# Patient Record
Sex: Female | Born: 1990 | Race: White | Hispanic: No | Marital: Single | Smoking: Never smoker
Health system: Southern US, Community
[De-identification: ages and names within clinical notes are randomized; demographics above are authoritative.]

## PROBLEM LIST (undated history)

## (undated) DIAGNOSIS — F32A Depression, unspecified: Secondary | ICD-10-CM

## (undated) HISTORY — DX: Depression, unspecified: F32.A

---

## 2015-08-29 ENCOUNTER — Ambulatory Visit (HOSPITAL_COMMUNITY)
Admission: EM | Admit: 2015-08-29 | Discharge: 2015-08-29 | Disposition: A | Discharge status: Home / Self Care | Payer: Self-pay | Attending: Family Medicine | Admitting: Family Medicine

## 2015-08-29 ENCOUNTER — Encounter (HOSPITAL_COMMUNITY): Payer: Self-pay | Admitting: *Deleted

## 2015-08-29 DIAGNOSIS — S61209A Unspecified open wound of unspecified finger without damage to nail, initial encounter: Secondary | ICD-10-CM

## 2015-08-29 MED ORDER — TETANUS-DIPHTH-ACELL PERTUSSIS 5-2.5-18.5 LF-MCG/0.5 IM SUSP
INTRAMUSCULAR | Status: AC
  Filled 2015-08-29: qty 0.5

## 2015-08-29 MED ORDER — TETANUS-DIPHTH-ACELL PERTUSSIS 5-2.5-18.5 LF-MCG/0.5 IM SUSP
0.5000 mL | Freq: Once | INTRAMUSCULAR | Status: AC
  Administered 2015-08-29: 0.5 mL via INTRAMUSCULAR

## 2015-08-29 MED ORDER — BUPIVACAINE HCL (PF) 0.5 % IJ SOLN
INTRAMUSCULAR | Status: AC
  Filled 2015-08-29: qty 10

## 2015-08-29 NOTE — ED Provider Notes (Signed)
CSN: [id number]     Arrival date & time 08/29/15  1823 History   First MD Initiated Contact with Patient 08/29/15 1932     Chief Complaint  Patient presents with  . Finger Injury   (Consider location/radiation/quality/duration/timing/severity/associated sxs/prior Treatment) Patient is a 25 y.o. female presenting with skin laceration. The history is provided by the patient and the spouse.  Laceration Location:  Finger Finger laceration location:  R ring finger Depth:  Through dermis Quality: avulsion   Bleeding: uncontrolled   Time since incident:  1 hour Laceration mechanism:  Metal edge Pain details:    Progression:  Unchanged Foreign body present:  No foreign bodies Ineffective treatments:  Pressure Tetanus status:  Out of date   History reviewed. No pertinent past medical history. History reviewed. No pertinent past surgical history. History reviewed. No pertinent family history. Social History  Substance Use Topics  . Smoking status: Never Smoker   . Smokeless tobacco: None  . Alcohol Use: Yes   OB History    No data available     Review of Systems  Constitutional: Negative.   Skin: Positive for wound.  All other systems reviewed and are negative.   Allergies  Review of patient's allergies indicates no known allergies.  Home Medications   Prior to Admission medications   Not on File   Meds Ordered and Administered this Visit   Medications  Tdap (BOOSTRIX) injection 0.5 mL (0.5 mLs Intramuscular Given 08/29/15 2032)    BP 124/80 mmHg  Pulse 51  Temp(Src) 98.5 F (36.9 C) (Oral)  Resp 12  SpO2 100%  LMP 08/14/2015 No data found.   Physical Exam  Constitutional: She is oriented to person, place, and time.  Musculoskeletal: She exhibits tenderness.  Oval 0.8 x 0.4 cm superficial skin avulsion of tip of rrf, nail intact., persistent bleeding.  Neurological: She is alert and oriented to person, place, and time.  Skin: Skin is warm and dry.   Nursing note and vitals reviewed.   ED Course  .Marland Kitchen.Laceration Repair Date/Time: 08/29/2015 8:22 PM Performed by: Linna HoffKINDL, JAMES D Authorized by: Bradd CanaryKINDL, JAMES D Consent: Verbal consent obtained. Risks and benefits: risks, benefits and alternatives were discussed Consent given by: patient Body area: upper extremity Location details: right ring finger Laceration length: 1 cm Foreign bodies: no foreign bodies Tendon involvement: none Nerve involvement: none Vascular damage: no Anesthesia: local infiltration Local anesthetic: bupivacaine 0.5% without epinephrine Anesthetic total: 2 ml Patient sedated: no Amount of cleaning: standard Debridement: none Degree of undermining: none Approximation difficulty: simple Dressing: gauze roll (silver nitrate hemostasis cautery and xeroform dsg and splint applied.)   (including critical care time)  Labs Review Labs Reviewed - No data to display  Imaging Review No results found.   Visual Acuity Review  Right Eye Distance:   Left Eye Distance:   Bilateral Distance:    Right Eye Near:   Left Eye Near:    Bilateral Near:         MDM   1. Avulsion of skin of finger, initial encounter        Linna HoffJames D Kindl, MD 08/29/15 2102

## 2015-08-29 NOTE — Discharge Instructions (Signed)
You had a tdap booster, leave bandaged for 1-2 days then bacitracin and bandaid until healed, expect 2-3 week healing time overall. Return if further problems

## 2015-08-29 NOTE — ED Notes (Signed)
Pt  Sustained  A  Finger  Laceration  To    r  Ring  Finger   Sustained    When  She  Was using  A  Cutting  Board        wound    Appears  To  Be  An avulsion

## 2017-10-16 LAB — HM PAP SMEAR

## 2020-03-02 ENCOUNTER — Other Ambulatory Visit: Payer: Self-pay

## 2020-03-02 ENCOUNTER — Encounter (HOSPITAL_COMMUNITY): Payer: Self-pay | Admitting: Emergency Medicine

## 2020-03-02 ENCOUNTER — Inpatient Hospital Stay (HOSPITAL_COMMUNITY): Payer: BC Managed Care – PPO

## 2020-03-02 ENCOUNTER — Ambulatory Visit (HOSPITAL_COMMUNITY)
Admission: EM | Admit: 2020-03-02 | Discharge: 2020-03-03 | Disposition: A | Discharge status: Home / Self Care | Payer: BC Managed Care – PPO | Attending: Obstetrics and Gynecology | Admitting: Obstetrics and Gynecology

## 2020-03-02 DIAGNOSIS — R1110 Vomiting, unspecified: Secondary | ICD-10-CM | POA: Insufficient documentation

## 2020-03-02 DIAGNOSIS — O3680X0 Pregnancy with inconclusive fetal viability, not applicable or unspecified: Secondary | ICD-10-CM

## 2020-03-02 DIAGNOSIS — O209 Hemorrhage in early pregnancy, unspecified: Secondary | ICD-10-CM

## 2020-03-02 DIAGNOSIS — O0000 Abdominal pregnancy without intrauterine pregnancy: Secondary | ICD-10-CM | POA: Diagnosis present

## 2020-03-02 DIAGNOSIS — Z20822 Contact with and (suspected) exposure to covid-19: Secondary | ICD-10-CM | POA: Insufficient documentation

## 2020-03-02 DIAGNOSIS — K661 Hemoperitoneum: Secondary | ICD-10-CM

## 2020-03-02 HISTORY — DX: Depression, unspecified: F32.A

## 2020-03-02 LAB — WET PREP, GENITAL
Clue Cells Wet Prep HPF POC: NONE SEEN
Sperm: NONE SEEN
Trich, Wet Prep: NONE SEEN
Yeast Wet Prep HPF POC: NONE SEEN

## 2020-03-02 LAB — CBC
HCT: 40.4 % (ref 36.0–46.0)
Hemoglobin: 13.5 g/dL (ref 12.0–15.0)
MCH: 30.3 pg (ref 26.0–34.0)
MCHC: 33.4 g/dL (ref 30.0–36.0)
MCV: 90.6 fL (ref 80.0–100.0)
Platelets: 209 10*3/uL (ref 150–400)
RBC: 4.46 MIL/uL (ref 3.87–5.11)
RDW: 12.1 % (ref 11.5–15.5)
WBC: 7.6 10*3/uL (ref 4.0–10.5)
nRBC: 0 % (ref 0.0–0.2)

## 2020-03-02 LAB — URINALYSIS, ROUTINE W REFLEX MICROSCOPIC
Bilirubin Urine: NEGATIVE
Glucose, UA: NEGATIVE mg/dL
Hgb urine dipstick: NEGATIVE
Ketones, ur: NEGATIVE mg/dL
Leukocytes,Ua: NEGATIVE
Nitrite: NEGATIVE
Protein, ur: NEGATIVE mg/dL
Specific Gravity, Urine: 1.023 (ref 1.005–1.030)
pH: 6 (ref 5.0–8.0)

## 2020-03-02 LAB — COMPREHENSIVE METABOLIC PANEL
ALT: 39 U/L (ref 0–44)
AST: 42 U/L — ABNORMAL HIGH (ref 15–41)
Albumin: 4.1 g/dL (ref 3.5–5.0)
Alkaline Phosphatase: 34 U/L — ABNORMAL LOW (ref 38–126)
Anion gap: 9 (ref 5–15)
BUN: 12 mg/dL (ref 6–20)
CO2: 24 mmol/L (ref 22–32)
Calcium: 9.2 mg/dL (ref 8.9–10.3)
Chloride: 104 mmol/L (ref 98–111)
Creatinine, Ser: 0.81 mg/dL (ref 0.44–1.00)
GFR, Estimated: >60 mL/min (ref >60)
Glucose, Bld: 84 mg/dL (ref 70–99)
Potassium: 3.9 mmol/L (ref 3.5–5.1)
Sodium: 137 mmol/L (ref 135–145)
Total Bilirubin: 1.1 mg/dL (ref 0.3–1.2)
Total Protein: 7.3 g/dL (ref 6.5–8.1)

## 2020-03-02 LAB — I-STAT BETA HCG BLOOD, ED (MC, WL, AP ONLY): I-stat hCG, quantitative: >2000 m[IU]/mL — ABNORMAL HIGH (ref <5)

## 2020-03-02 LAB — ABO/RH: ABO/RH(D): A POS

## 2020-03-02 LAB — LIPASE, BLOOD: Lipase: 27 U/L (ref 11–51)

## 2020-03-02 MED ORDER — ACETAMINOPHEN 500 MG PO TABS
1000.0000 mg | ORAL_TABLET | Freq: Once | ORAL | Status: AC
  Administered 2020-03-02: 1000 mg via ORAL
  Filled 2020-03-02: qty 2

## 2020-03-02 NOTE — ED Triage Notes (Signed)
Emergency Medicine Provider OB Triage Evaluation Note  Emily Parsons is a 29 y.o. female, No obstetric history on file., at Unknown gestation who presents to the emergency department with complaints of abdominal discomfort with associated nausea and emesis.  Patient reports that her last menses was approximately 2 weeks ago.  She and her husband have been trying to get pregnant for the past 5 months.  She does not use Clomid or any other hormonal medications.  She states her last menses was 2 weeks ago and she is typically quite regular.  She was concerned when she had an at home positive pregnancy test and came to the ED for further evaluation.  She did have mild spotting a few days ago, but no active bleeding.  Review of  Systems  Positive: Suprapubic abdominal cramping pain.  Nausea. Negative: Vaginal bleeding.  Fevers.  Physical Exam  BP 118/72 (BP Location: Right Arm)   Pulse 75   Temp 98.9 F (37.2 C) (Oral)   Resp 16   SpO2 100%  General: Awake, no distress  HEENT: Atraumatic  Resp: Normal effort  Cardiac: Normal rate Abd: Nondistended, nontender  MSK: Moves all extremities without difficulty Neuro: Speech clear  Medical Decision Making  Pt evaluated for pregnancy concern and is stable for transfer to MAU. Pt is in agreement with plan for transfer.  9:41 PM Discussed with MAU APP, Emily Parsons, who accepts patient in transfer.  Clinical Impression  No diagnosis found.     Emily New, PA-C 03/02/20 2141

## 2020-03-02 NOTE — MAU Note (Signed)
PT SAYS SHE HAD SPOTTING ON Sunday . HAS CRAMPING - STARTED 2 PM- . VOMITING STARTED AT 4 PM .   WENT  TO [facility] - HAD POSITIVE UPT AT HOME

## 2020-03-02 NOTE — MAU Provider Note (Signed)
History     CSN: [id number]  Arrival date and time: 03/02/20 9476   Event Date/Time   First Provider Initiated Contact with Patient 03/02/20 2302      Chief Complaint  Patient presents with  . Abdominal Pain   Emily Parsons is a 29 y.o. G3P1001 at [redacted]w[redacted]d by Definite Dec 3rd, 2021.  She states the menses was early and heavier than usual, but normal.  Patient reports she is actively trying to conceive and had positive ovulation sticks last Tuesday.   She presents today for Abdominal Pain.  She states around 2pm she started having abdominal pain that she describes as "dull pressure."  She states she initially thought it was related to bowel movements and she had one with no improvement of symptoms.  She states she later had sexual intercourse and it increased to a cramping sensations.  She states now "pushing with peeing on the toilet makes the pain worse."  However, she does not feel like it is a UTI. She states she has also had some vomiting.    OB History    Gravida  3   Para  1   Term  1   Preterm      AB      Living  1     SAB      IAB      Ectopic      Multiple      Live Births  1           Past Medical History:  Diagnosis Date  . Depression     History reviewed. No pertinent surgical history.  No family history on file.  Social History   Tobacco Use  . Smoking status: Never Smoker  Substance Use Topics  . Alcohol use: Yes    Allergies: No Known Allergies  No medications prior to admission.    Review of Systems  Constitutional: Negative for chills and fever.  Respiratory: Negative for cough and shortness of breath.   Gastrointestinal: Positive for abdominal pain, nausea and vomiting.  Genitourinary: Positive for pelvic pain. Negative for difficulty urinating, dysuria, vaginal bleeding and vaginal discharge.  Musculoskeletal: Negative for back pain.  Neurological: Negative for dizziness, light-headedness and headaches.   Physical Exam    Blood pressure 112/73, pulse 67, temperature 98.5 F (36.9 C), temperature source Oral, resp. rate 18, height 5\' 7"  (1.702 m), weight 63.3 kg, last menstrual period 02/13/2020, SpO2 100 %.  Physical Exam Constitutional:      Appearance: Normal appearance. She is well-developed.  HENT:     Head: Normocephalic and atraumatic.  Eyes:     Conjunctiva/sclera: Conjunctivae normal.  Cardiovascular:     Rate and Rhythm: Normal rate and regular rhythm.     Heart sounds: Normal heart sounds.  Pulmonary:     Effort: Pulmonary effort is normal. No respiratory distress.     Breath sounds: Normal breath sounds.  Abdominal:     General: Bowel sounds are normal.     Palpations: Abdomen is soft.     Tenderness: There is abdominal tenderness in the right lower quadrant, suprapubic area and left lower quadrant.  Musculoskeletal:        General: Normal range of motion.  Skin:    General: Skin is warm and dry.  Neurological:     Mental Status: She is alert and oriented to person, place, and time.  Psychiatric:        Mood and Affect: Mood normal.  Behavior: Behavior normal.        Thought Content: Thought content normal.     MAU Course  Procedures Results for orders placed or performed during the hospital encounter of 03/02/20 (from the past 24 hour(s))  Urinalysis, Routine w reflex microscopic Urine, Clean Catch     Status: None   Collection Time: 03/02/20  8:30 PM  Result Value Ref Range   Color, Urine YELLOW YELLOW   APPearance CLEAR CLEAR   Specific Gravity, Urine 1.023 1.005 - 1.030   pH 6.0 5.0 - 8.0   Glucose, UA NEGATIVE NEGATIVE mg/dL   Hgb urine dipstick NEGATIVE NEGATIVE   Bilirubin Urine NEGATIVE NEGATIVE   Ketones, ur NEGATIVE NEGATIVE mg/dL   Protein, ur NEGATIVE NEGATIVE mg/dL   Nitrite NEGATIVE NEGATIVE   Leukocytes,Ua NEGATIVE NEGATIVE  Lipase, blood     Status: None   Collection Time: 03/02/20  8:42 PM  Result Value Ref Range   Lipase 27 11 - 51 U/L   Comprehensive metabolic panel     Status: Abnormal   Collection Time: 03/02/20  8:42 PM  Result Value Ref Range   Sodium 137 135 - 145 mmol/L   Potassium 3.9 3.5 - 5.1 mmol/L   Chloride 104 98 - 111 mmol/L   CO2 24 22 - 32 mmol/L   Glucose, Bld 84 70 - 99 mg/dL   BUN 12 6 - 20 mg/dL   Creatinine, Ser 0.53 0.44 - 1.00 mg/dL   Calcium 9.2 8.9 - 97.6 mg/dL   Total Protein 7.3 6.5 - 8.1 g/dL   Albumin 4.1 3.5 - 5.0 g/dL   AST 42 (H) 15 - 41 U/L   ALT 39 0 - 44 U/L   Alkaline Phosphatase 34 (L) 38 - 126 U/L   Total Bilirubin 1.1 0.3 - 1.2 mg/dL   GFR, Estimated >73 >41 mL/min   Anion gap 9 5 - 15  CBC     Status: None   Collection Time: 03/02/20  8:42 PM  Result Value Ref Range   WBC 7.6 4.0 - 10.5 K/uL   RBC 4.46 3.87 - 5.11 MIL/uL   Hemoglobin 13.5 12.0 - 15.0 g/dL   HCT 93.7 90.2 - 40.9 %   MCV 90.6 80.0 - 100.0 fL   MCH 30.3 26.0 - 34.0 pg   MCHC 33.4 30.0 - 36.0 g/dL   RDW 73.5 32.9 - 92.4 %   Platelets 209 150 - 400 K/uL   nRBC 0.0 0.0 - 0.2 %  I-Stat beta hCG blood, ED     Status: Abnormal   Collection Time: 03/02/20  9:15 PM  Result Value Ref Range   I-stat hCG, quantitative >2,000.0 (H) <5 mIU/mL   Comment 3          Wet prep, genital     Status: Abnormal   Collection Time: 03/02/20 11:09 PM   Specimen: PATH Cytology Cervicovaginal Ancillary Only  Result Value Ref Range   Yeast Wet Prep HPF POC NONE SEEN NONE SEEN   Trich, Wet Prep NONE SEEN NONE SEEN   Clue Cells Wet Prep HPF POC NONE SEEN NONE SEEN   WBC, Wet Prep HPF POC FEW (A) NONE SEEN   Sperm NONE SEEN   hCG, quantitative, pregnancy     Status: Abnormal   Collection Time: 03/02/20 11:19 PM  Result Value Ref Range   hCG, Beta Chain, Quant, S 7,705 (H) <5 mIU/mL  ABO/Rh     Status: None   Collection Time: 03/02/20 11:19  PM  Result Value Ref Range   ABO/RH(D) A POS    No rh immune globuloin      NOT A RH IMMUNE GLOBULIN CANDIDATE, PT RH POSITIVE Performed at [facility], [address]  [place], [place] [address]    US OB LESS THAN 14 WEEKS WITH [place] TRANSVAGINAL  Result Date: 03/03/2020 CLINICAL DATA:  Vaginal bleeding. EXAM: OBSTETRIC <14 WK [place] AND TRANSVAGINAL OB US TECHNIQUE: Both transabdominal and transvaginal ultrasound examinations were performed for complete evaluation of the gestation as well as the maternal uterus, adnexal regions, and pelvic cul-de-sac. Transvaginal technique was performed to assess early pregnancy. COMPARISON:  None. FINDINGS: Intrauterine gestational sac: None Yolk sac:  Not Visualized. Embryo:  Not Visualized. Cardiac Activity: Not Visualized. Heart Rate: N/A  bpm Subchorionic hemorrhage:  None visualized. Maternal uterus/adnexae: A 5.4 cm x 1.4 cm complex-appearing area of heterogeneous hypoechogenicity is seen posterior to the uterus. This area demonstrates no flow on color Doppler evaluation and contains a 2.0 cm x 1.2 cm component of heterogeneous echogenicity with a well-defined anechoic structure. No pain is experienced by the patient during the examination of this area, as per the ultrasound technologist. The right ovary measures 3.7 cm x 4.4 cm x 3.0 cm and is heterogeneous in appearance. The patient experienced an extensive amount of pain during ultrasonographic examination of this region. The left ovary measures 2.7 cm x 3.7 cm x 2.7 cm and is normal in appearance. A small to moderate amount of pelvic free fluid is seen. IMPRESSION: 1. No evidence of an intrauterine pregnancy. 2. Complex area of heterogeneous echogenicity posterior to the uterus which is suspicious for the presence of blood products related to an ectopic pregnancy. Correlation with follow-up pelvic ultrasound and serial beta HCG levels is recommended. Electronically Signed   By: Aram Candela M.D.   On: 03/03/2020 00:17    MDM Physical Exam Wet Prep and GC/CT Labs: UA, UPT, CBC, hCG, ABO Ultrasound Pain Medication Consult Assessment and Plan  29 year old G3P1001 at 2.5  weeks Abdominal Pain Vomiting  -POC reviewed. -Patient to collect swabs. -Offered and accepts pain medication; will give tylenol. -Labs drawn at [facility].  Will add-on as appropriate. -Will send for [place] and await results.   Cherre Robins 03/02/2020, 11:02 PM   Reassessment (12:13 AM) -Dr. Londell Moh calls and reviews [place]. Expresses concern for ectopic pregnancy and possible free fluid.   -hCG remains pending.  Will await results.   Reassessment (12:59 AM)  -hCG returns at 7705 -Dr. Chana Bode consulted and informed of patient status and results.  Will come to bedside to assess and offer surgery. -Patient and husband informed of results and that MD will come to discuss recommendation. -Patient reports she last ate at around lunch time.   Cherre Robins MSN, CNM Advanced Practice Provider, [facility] [employer]

## 2020-03-02 NOTE — ED Triage Notes (Signed)
Patient with abdominal pain, nausea and vomiting.  She and husband have been trying to conceive, states they have been using ovulation sticks, had intercourse once last week and got a positive pregnancy test today.

## 2020-03-03 ENCOUNTER — Inpatient Hospital Stay (HOSPITAL_COMMUNITY): Payer: BC Managed Care – PPO | Admitting: Anesthesiology

## 2020-03-03 ENCOUNTER — Encounter (HOSPITAL_COMMUNITY): Admission: EM | Disposition: A | Discharge status: Home / Self Care | Payer: Self-pay | Source: Home / Self Care

## 2020-03-03 ENCOUNTER — Other Ambulatory Visit: Payer: Self-pay | Admitting: Family Medicine

## 2020-03-03 DIAGNOSIS — O0000 Abdominal pregnancy without intrauterine pregnancy: Secondary | ICD-10-CM

## 2020-03-03 DIAGNOSIS — K661 Hemoperitoneum: Secondary | ICD-10-CM

## 2020-03-03 DIAGNOSIS — O3680X0 Pregnancy with inconclusive fetal viability, not applicable or unspecified: Secondary | ICD-10-CM

## 2020-03-03 HISTORY — PX: DIAGNOSTIC LAPAROSCOPY WITH REMOVAL OF ECTOPIC PREGNANCY: SHX6449

## 2020-03-03 LAB — RESP PANEL BY RT-PCR (FLU A&B, COVID) ARPGX2
Influenza A by PCR: NEGATIVE
Influenza B by PCR: NEGATIVE
SARS Coronavirus 2 by RT PCR: NEGATIVE

## 2020-03-03 LAB — CBC
HCT: 39.4 % (ref 36.0–46.0)
Hemoglobin: 13.6 g/dL (ref 12.0–15.0)
MCH: 30.9 pg (ref 26.0–34.0)
MCHC: 34.5 g/dL (ref 30.0–36.0)
MCV: 89.5 fL (ref 80.0–100.0)
Platelets: 202 10*3/uL (ref 150–400)
RBC: 4.4 MIL/uL (ref 3.87–5.11)
RDW: 12.1 % (ref 11.5–15.5)
WBC: 10.2 10*3/uL (ref 4.0–10.5)
nRBC: 0 % (ref 0.0–0.2)

## 2020-03-03 LAB — GC/CHLAMYDIA PROBE AMP (~~LOC~~) NOT AT ARMC
Chlamydia: NEGATIVE
Comment: NEGATIVE
Comment: NORMAL
Neisseria Gonorrhea: NEGATIVE

## 2020-03-03 LAB — TYPE AND SCREEN
ABO/RH(D): A POS
Antibody Screen: NEGATIVE

## 2020-03-03 LAB — HCG, QUANTITATIVE, PREGNANCY: hCG, Beta Chain, Quant, S: 7705 m[IU]/mL — ABNORMAL HIGH (ref <5)

## 2020-03-03 SURGERY — LAPAROSCOPY, WITH ECTOPIC PREGNANCY SURGICAL TREATMENT
Anesthesia: General

## 2020-03-03 MED ORDER — SUGAMMADEX SODIUM 200 MG/2ML IV SOLN
INTRAVENOUS | Status: DC | PRN
  Administered 2020-03-03: 200 mg via INTRAVENOUS

## 2020-03-03 MED ORDER — FENTANYL CITRATE (PF) 100 MCG/2ML IJ SOLN
25.0000 ug | INTRAMUSCULAR | Status: DC | PRN
  Administered 2020-03-03: 25 ug via INTRAVENOUS

## 2020-03-03 MED ORDER — ROCURONIUM BROMIDE 10 MG/ML (PF) SYRINGE
PREFILLED_SYRINGE | INTRAVENOUS | Status: AC
  Filled 2020-03-03: qty 10

## 2020-03-03 MED ORDER — SOD CITRATE-CITRIC ACID 500-334 MG/5ML PO SOLN
ORAL | Status: AC
  Filled 2020-03-03: qty 15

## 2020-03-03 MED ORDER — DOCUSATE SODIUM 100 MG PO CAPS: 100.0000 mg | ORAL_CAPSULE | Freq: Two times a day (BID) | ORAL | 2 refills | Status: AC | PRN

## 2020-03-03 MED ORDER — ONDANSETRON HCL 4 MG/2ML IJ SOLN
INTRAMUSCULAR | Status: DC | PRN
  Administered 2020-03-03: 4 mg via INTRAVENOUS

## 2020-03-03 MED ORDER — SOD CITRATE-CITRIC ACID 500-334 MG/5ML PO SOLN
30.0000 mL | Freq: Once | ORAL | Status: AC
  Administered 2020-03-03: 02:00:00 30 mL via ORAL

## 2020-03-03 MED ORDER — SODIUM CHLORIDE 0.9 % IR SOLN
Status: DC | PRN
  Administered 2020-03-03: 1000 mL

## 2020-03-03 MED ORDER — DEXAMETHASONE SODIUM PHOSPHATE 10 MG/ML IJ SOLN
INTRAMUSCULAR | Status: AC
  Filled 2020-03-03: qty 3

## 2020-03-03 MED ORDER — DEXAMETHASONE SODIUM PHOSPHATE 4 MG/ML IJ SOLN
INTRAMUSCULAR | Status: DC | PRN
  Administered 2020-03-03: 10 mg via INTRAVENOUS

## 2020-03-03 MED ORDER — IBUPROFEN 600 MG PO TABS: 600.0000 mg | ORAL_TABLET | Freq: Four times a day (QID) | ORAL | 3 refills | Status: AC | PRN

## 2020-03-03 MED ORDER — FENTANYL CITRATE (PF) 100 MCG/2ML IJ SOLN
INTRAMUSCULAR | Status: DC | PRN
  Administered 2020-03-03: 100 ug via INTRAVENOUS

## 2020-03-03 MED ORDER — FAMOTIDINE IN NACL 20-0.9 MG/50ML-% IV SOLN
INTRAVENOUS | Status: AC
  Filled 2020-03-03: qty 50

## 2020-03-03 MED ORDER — POVIDONE-IODINE 10 % EX SWAB: 2.0000 "application " | Freq: Once | CUTANEOUS | Status: DC

## 2020-03-03 MED ORDER — LIDOCAINE HCL (CARDIAC) PF 100 MG/5ML IV SOSY
PREFILLED_SYRINGE | INTRAVENOUS | Status: DC | PRN
  Administered 2020-03-03: 100 mg via INTRAVENOUS

## 2020-03-03 MED ORDER — MIDAZOLAM HCL 5 MG/5ML IJ SOLN
INTRAMUSCULAR | Status: DC | PRN
  Administered 2020-03-03: 2 mg via INTRAVENOUS

## 2020-03-03 MED ORDER — FAMOTIDINE IN NACL 20-0.9 MG/50ML-% IV SOLN
20.0000 mg | Freq: Once | INTRAVENOUS | Status: AC
  Administered 2020-03-03: 02:00:00 20 mg via INTRAVENOUS

## 2020-03-03 MED ORDER — OXYCODONE-ACETAMINOPHEN 5-325 MG PO TABS: 1.0000 | ORAL_TABLET | Freq: Four times a day (QID) | ORAL | 0 refills | Status: AC | PRN

## 2020-03-03 MED ORDER — ONDANSETRON HCL 4 MG/2ML IJ SOLN
INTRAMUSCULAR | Status: AC
  Filled 2020-03-03: qty 2

## 2020-03-03 MED ORDER — LIDOCAINE 2% (20 MG/ML) 5 ML SYRINGE
INTRAMUSCULAR | Status: AC
  Filled 2020-03-03: qty 15

## 2020-03-03 MED ORDER — LIDOCAINE 2% (20 MG/ML) 5 ML SYRINGE
INTRAMUSCULAR | Status: AC
  Filled 2020-03-03: qty 5

## 2020-03-03 MED ORDER — SUCCINYLCHOLINE CHLORIDE 20 MG/ML IJ SOLN
INTRAMUSCULAR | Status: DC | PRN
  Administered 2020-03-03: 120 mg via INTRAVENOUS

## 2020-03-03 MED ORDER — FENTANYL CITRATE (PF) 250 MCG/5ML IJ SOLN
INTRAMUSCULAR | Status: AC
  Filled 2020-03-03: qty 5

## 2020-03-03 MED ORDER — MIDAZOLAM HCL 2 MG/2ML IJ SOLN
INTRAMUSCULAR | Status: AC
  Filled 2020-03-03: qty 2

## 2020-03-03 MED ORDER — PHENYLEPHRINE 40 MCG/ML (10ML) SYRINGE FOR IV PUSH (FOR BLOOD PRESSURE SUPPORT)
PREFILLED_SYRINGE | INTRAVENOUS | Status: DC | PRN
  Administered 2020-03-03 (×2): 80 ug via INTRAVENOUS

## 2020-03-03 MED ORDER — PROPOFOL 10 MG/ML IV BOLUS
INTRAVENOUS | Status: DC | PRN
  Administered 2020-03-03: 130 mg via INTRAVENOUS

## 2020-03-03 MED ORDER — LACTATED RINGERS IV SOLN: INTRAVENOUS | Status: DC

## 2020-03-03 MED ORDER — DEXAMETHASONE SODIUM PHOSPHATE 10 MG/ML IJ SOLN
INTRAMUSCULAR | Status: AC
  Filled 2020-03-03: qty 1

## 2020-03-03 MED ORDER — PROMETHAZINE HCL 25 MG/ML IJ SOLN: 6.2500 mg | INTRAMUSCULAR | Status: DC | PRN

## 2020-03-03 MED ORDER — BUPIVACAINE HCL (PF) 0.25 % IJ SOLN
INTRAMUSCULAR | Status: AC
  Filled 2020-03-03: qty 30

## 2020-03-03 MED ORDER — ONDANSETRON HCL 4 MG/2ML IJ SOLN
INTRAMUSCULAR | Status: AC
  Filled 2020-03-03: qty 8

## 2020-03-03 MED ORDER — FENTANYL CITRATE (PF) 100 MCG/2ML IJ SOLN
INTRAMUSCULAR | Status: AC
  Filled 2020-03-03: qty 2

## 2020-03-03 MED ORDER — ROCURONIUM BROMIDE 100 MG/10ML IV SOLN
INTRAVENOUS | Status: DC | PRN
  Administered 2020-03-03: 40 mg via INTRAVENOUS

## 2020-03-03 MED ORDER — PROPOFOL 10 MG/ML IV BOLUS
INTRAVENOUS | Status: AC
  Filled 2020-03-03: qty 20

## 2020-03-03 MED ORDER — BUPIVACAINE HCL (PF) 0.25 % IJ SOLN
INTRAMUSCULAR | Status: DC | PRN
  Administered 2020-03-03: 10 mL

## 2020-03-03 SURGICAL SUPPLY — 34 items
ADH SKN CLS APL DERMABOND .7 (GAUZE/BANDAGES/DRESSINGS) ×4
BAG SPEC RTRVL LRG 6X4 10 (ENDOMECHANICALS) ×2
COVER WAND RF STERILE (DRAPES) ×4 IMPLANT
DERMABOND ADVANCED (GAUZE/BANDAGES/DRESSINGS) ×4
DERMABOND ADVANCED .7 DNX12 (GAUZE/BANDAGES/DRESSINGS) ×4 IMPLANT
DRSG OPSITE POSTOP 3X4 (GAUZE/BANDAGES/DRESSINGS) ×4 IMPLANT
DURAPREP 26ML APPLICATOR (WOUND CARE) ×8 IMPLANT
ELECT REM PT RETURN 9FT ADLT (ELECTROSURGICAL)
ELECTRODE REM PT RTRN 9FT ADLT (ELECTROSURGICAL) IMPLANT
GLOVE BIOGEL PI IND STRL 6.5 (GLOVE) ×8 IMPLANT
GLOVE BIOGEL PI IND STRL 7.0 (GLOVE) ×8 IMPLANT
GLOVE BIOGEL PI INDICATOR 6.5 (GLOVE) ×8
GLOVE BIOGEL PI INDICATOR 7.0 (GLOVE) ×8
GLOVE SURG SS PI 6.5 STRL IVOR (GLOVE) ×8 IMPLANT
GOWN STRL REUS W/ TWL LRG LVL3 (GOWN DISPOSABLE) ×8 IMPLANT
GOWN STRL REUS W/TWL LRG LVL3 (GOWN DISPOSABLE) ×16
KIT TURNOVER KIT B (KITS) ×8 IMPLANT
NS IRRIG 1000ML POUR BTL (IV SOLUTION) ×8 IMPLANT
PACK LAPAROSCOPY BASIN (CUSTOM PROCEDURE TRAY) ×8 IMPLANT
PACK TRENDGUARD 450 HYBRID PRO (MISCELLANEOUS) ×2 IMPLANT
POUCH SPECIMEN RETRIEVAL 10MM (ENDOMECHANICALS) ×4 IMPLANT
PROTECTOR NERVE ULNAR (MISCELLANEOUS) ×16 IMPLANT
SET IRRIG TUBING LAPAROSCOPIC (IRRIGATION / IRRIGATOR) ×4 IMPLANT
SET TUBE SMOKE EVAC HIGH FLOW (TUBING) ×8 IMPLANT
SHEARS HARMONIC ACE PLUS 36CM (ENDOMECHANICALS) ×4 IMPLANT
SLEEVE ENDOPATH XCEL 5M (ENDOMECHANICALS) ×8 IMPLANT
SUT MNCRL AB 4-0 PS2 18 (SUTURE) ×8 IMPLANT
SUT VICRYL 0 UR6 27IN ABS (SUTURE) ×16 IMPLANT
TOWEL GREEN STERILE FF (TOWEL DISPOSABLE) ×16 IMPLANT
TRAY FOLEY W/BAG SLVR 14FR (SET/KITS/TRAYS/PACK) ×8 IMPLANT
TRENDGUARD 450 HYBRID PRO PACK (MISCELLANEOUS) ×4
TROCAR BALLN 12MMX100 BLUNT (TROCAR) ×8 IMPLANT
TROCAR XCEL NON-BLD 5MMX100MML (ENDOMECHANICALS) ×8 IMPLANT
WARMER LAPAROSCOPE (MISCELLANEOUS) ×4 IMPLANT

## 2020-03-03 NOTE — H&P (Signed)
Emily Parsons is an 29 y.o. female G2P1001 at [redacted]w[redacted]d by certain LMP presenting for evaluation of lower abdominal pain that started earlier around 2 pm. Pain was initially described as a dull pain but is now more a cramping pain. She denies nausea or emesis. She denies feeling dizzy or lightheaded     Menstrual History: Patient's last menstrual period was 02/13/2020.    Past Medical History:  Diagnosis Date   Depression     History reviewed. No pertinent surgical history.  No family history on file.  Social History:  reports that she has never smoked. She does not have any smokeless tobacco history on file. She reports current alcohol use. No history on file for drug use.  Allergies: No Known Allergies  No medications prior to admission.    Review of Systems See pertinent in HPI. All other systems reviewed and non contributory Blood pressure 112/73, pulse 67, temperature 98.5 F (36.9 C), temperature source Oral, resp. rate 18, height 5\' 7"  (1.702 m), weight 63.3 kg, last menstrual period 02/13/2020, SpO2 100 %. Physical Exam GENERAL: Well-developed, well-nourished female in no acute distress.  LUNGS: Clear to auscultation bilaterally.  HEART: Regular rate and rhythm. ABDOMEN: Soft, mild tenderness in lower abdomen (right greater than left), nondistended. No organomegaly. PELVIC: Deferred to OR EXTREMITIES: No cyanosis, clubbing, or edema, 2+ distal pulses.  Results for orders placed or performed during the hospital encounter of 03/02/20 (from the past 24 hour(s))  Urinalysis, Routine w reflex microscopic Urine, Clean Catch     Status: None   Collection Time: 03/02/20  8:30 PM  Result Value Ref Range   Color, Urine YELLOW YELLOW   APPearance CLEAR CLEAR   Specific Gravity, Urine 1.023 1.005 - 1.030   pH 6.0 5.0 - 8.0   Glucose, UA NEGATIVE NEGATIVE mg/dL   Hgb urine dipstick NEGATIVE NEGATIVE   Bilirubin Urine NEGATIVE NEGATIVE   Ketones, ur NEGATIVE NEGATIVE mg/dL    Protein, ur NEGATIVE NEGATIVE mg/dL   Nitrite NEGATIVE NEGATIVE   Leukocytes,Ua NEGATIVE NEGATIVE  Lipase, blood     Status: None   Collection Time: 03/02/20  8:42 PM  Result Value Ref Range   Lipase 27 11 - 51 U/L  Comprehensive metabolic panel     Status: Abnormal   Collection Time: 03/02/20  8:42 PM  Result Value Ref Range   Sodium 137 135 - 145 mmol/L   Potassium 3.9 3.5 - 5.1 mmol/L   Chloride 104 98 - 111 mmol/L   CO2 24 22 - 32 mmol/L   Glucose, Bld 84 70 - 99 mg/dL   BUN 12 6 - 20 mg/dL   Creatinine, Ser 03/04/20 0.44 - 1.00 mg/dL   Calcium 9.2 8.9 - 8.41 mg/dL   Total Protein 7.3 6.5 - 8.1 g/dL   Albumin 4.1 3.5 - 5.0 g/dL   AST 42 (H) 15 - 41 U/L   ALT 39 0 - 44 U/L   Alkaline Phosphatase 34 (L) 38 - 126 U/L   Total Bilirubin 1.1 0.3 - 1.2 mg/dL   GFR, Estimated 66.0 >63 mL/min   Anion gap 9 5 - 15  CBC     Status: None   Collection Time: 03/02/20  8:42 PM  Result Value Ref Range   WBC 7.6 4.0 - 10.5 K/uL   RBC 4.46 3.87 - 5.11 MIL/uL   Hemoglobin 13.5 12.0 - 15.0 g/dL   HCT 03/04/20 60.1 - 09.3 %   MCV 90.6 80.0 - 100.0 fL  MCH 30.3 26.0 - 34.0 pg   MCHC 33.4 30.0 - 36.0 g/dL   RDW 74.1 42.3 - 95.3 %   Platelets 209 150 - 400 K/uL   nRBC 0.0 0.0 - 0.2 %  I-Stat beta hCG blood, ED     Status: Abnormal   Collection Time: 03/02/20  9:15 PM  Result Value Ref Range   I-stat hCG, quantitative >2,000.0 (H) <5 mIU/mL   Comment 3          Wet prep, genital     Status: Abnormal   Collection Time: 03/02/20 11:09 PM   Specimen: PATH Cytology Cervicovaginal Ancillary Only  Result Value Ref Range   Yeast Wet Prep HPF POC NONE SEEN NONE SEEN   Trich, Wet Prep NONE SEEN NONE SEEN   Clue Cells Wet Prep HPF POC NONE SEEN NONE SEEN   WBC, Wet Prep HPF POC FEW (A) NONE SEEN   Sperm NONE SEEN   hCG, quantitative, pregnancy     Status: Abnormal   Collection Time: 03/02/20 11:19 PM  Result Value Ref Range   hCG, Beta Chain, Quant, S 7,705 (H) <5 mIU/mL  ABO/Rh     Status: None    Collection Time: 03/02/20 11:19 PM  Result Value Ref Range   ABO/RH(D) A POS    No rh immune globuloin      NOT A RH IMMUNE GLOBULIN CANDIDATE, PT RH POSITIVE Performed at [facility], [address], [place], [place] [address]     US OB LESS THAN 14 WEEKS WITH [place] TRANSVAGINAL  Result Date: 03/03/2020 CLINICAL DATA:  Vaginal bleeding. EXAM: OBSTETRIC <14 WK [place] AND TRANSVAGINAL OB US TECHNIQUE: Both transabdominal and transvaginal ultrasound examinations were performed for complete evaluation of the gestation as well as the maternal uterus, adnexal regions, and pelvic cul-de-sac. Transvaginal technique was performed to assess early pregnancy. COMPARISON:  None. FINDINGS: Intrauterine gestational sac: None Yolk sac:  Not Visualized. Embryo:  Not Visualized. Cardiac Activity: Not Visualized. Heart Rate: N/A  bpm Subchorionic hemorrhage:  None visualized. Maternal uterus/adnexae: A 5.4 cm x 1.4 cm complex-appearing area of heterogeneous hypoechogenicity is seen posterior to the uterus. This area demonstrates no flow on color Doppler evaluation and contains a 2.0 cm x 1.2 cm component of heterogeneous echogenicity with a well-defined anechoic structure. No pain is experienced by the patient during the examination of this area, as per the ultrasound technologist. The right ovary measures 3.7 cm x 4.4 cm x 3.0 cm and is heterogeneous in appearance. The patient experienced an extensive amount of pain during ultrasonographic examination of this region. The left ovary measures 2.7 cm x 3.7 cm x 2.7 cm and is normal in appearance. A small to moderate amount of pelvic free fluid is seen. IMPRESSION: 1. No evidence of an intrauterine pregnancy. 2. Complex area of heterogeneous echogenicity posterior to the uterus which is suspicious for the presence of blood products related to an ectopic pregnancy. Correlation with follow-up pelvic ultrasound and serial beta HCG levels is recommended. Electronically Signed    By: Aram Candela M.D.   On: 03/03/2020 00:17    Assessment/Plan: 29 yo G2P1 with ultrasound findings concerning for an ectopic pregnancy - Discussed management with diagnostic laparoscopy with possible salpingectomy. Risks, benefits and alternatives were explained including but not limited to risks of bleeding, infection and damage to adjacent organs. Patient verbalized understanding and all questions were answered - Covid test pending - OR notified  Persis Graffius 03/03/2020, 1:10 AM

## 2020-03-03 NOTE — Anesthesia Postprocedure Evaluation (Signed)
Anesthesia Post Note  Patient: Emily Parsons  Procedure(s) Performed: DIAGNOSTIC LAPAROSCOPY WITH PERITONEAL SAMPLING     Patient location during evaluation: PACU Anesthesia Type: General Level of consciousness: sedated Pain management: pain level controlled Vital Signs Assessment: post-procedure vital signs reviewed and stable Respiratory status: spontaneous breathing and respiratory function stable Cardiovascular status: stable Postop Assessment: no apparent nausea or vomiting Anesthetic complications: no   No complications documented.  Last Vitals:  Vitals:   03/03/20 0524 03/03/20 0539  BP: 107/64 113/60  Pulse: 66 69  Resp: 13 (!) 8  Temp:  36.8 C  SpO2: 100% 100%    Last Pain:  Vitals:   03/03/20 0524  TempSrc:   PainSc: 0-No pain                 Cyani Kallstrom DANIEL

## 2020-03-03 NOTE — Op Note (Addendum)
Emily Parsons PROCEDURE DATE: 03/03/2020  PREOPERATIVE DIAGNOSES: ectopic pregnancy POSTOPERATIVE DIAGNOSES: Pregnancy of unknown location PROCEDURE: Diagnostic laparoscopy and evacuation of hemoperitoneum  SURGEON:  Dr. Catalina Antigua    INDICATIONS: 29 y.o. G3P1001 with ultrasound findings suspicious for ruptured ectopic pregnancy given quant HCG 7700 here today for definitive surgical management.   Risks of surgery were discussed with the patient including but not limited to: bleeding which may require transfusion or reoperation; infection which may require antibiotics; injury to bowel, bladder, ureters or other surrounding organs; need for additional procedures including laparotomy; thromboembolic phenomenon, incisional problems and other postoperative/anesthesia complications. Written informed consent was obtained.    FINDINGS:  Small uterus, normal right and left adnexa.  No evidence of endometriosis, adhesions or any other abdominal/pelvic abnormality.  Normal upper abdomen. 100 cc of hemoperitoneum evacuated. 2 cm area in the cul de sac of denuded peritoneum with Riker Collier slow ooze.  ANESTHESIA:    General INTRAVENOUS FLUIDS: 500 ml ESTIMATED BLOOD LOSS: 25 ml SPECIMENS:  Evacuated hemoperitoneum and peritoneal sampling COMPLICATIONS: None immediate   PROCEDURE IN DETAIL:  The patient was taken to the operating room where general anesthesia was administered and was found to be adequate.  She was placed in the dorsal lithotomy position, and was prepped and draped in a sterile manner.  A Foley catheter was inserted into her bladder and attached to [name] drainage and a uterine manipulator was then advanced into the uterus .  After an adequate timeout was performed, attention was then turned to the patient's abdomen where a 11-mm skin incision was made in the umbilical fold.  The fascia was identified, grasped with Kocher clamps, incised and tagged with 0-Vicryl.  The 11-mm trocar and  sleeve were then advanced without difficulty into the abdomen without difficulty and intraabdominal placement was confirmed by the laparoscope. A survey of the patient's pelvis and abdomen revealed the findings above.   A 5-mm left lower quadrant port  was then placed under direct visualization.   Upon evacuation of the hemoperitoneum, a 2 cm area in the cul de sac of denuded peritoneum was identified with some tissue adherent to it. The tissue was sent to pathology. Both fallopian tubes and ovaries appeared normal. Arista was applied on the surface of the denuded peritoneum. Hemostasis was achieved  No intraoperative injury to other surrounding organs was noted.  The abdomen was desufflated and all instruments were then removed from the patient's abdomen. The fascial incision of the umbilicus was closed with a 0 Vicryl figure of eight stitch.  All skin incisions were closed with 3-0 Vicryl subcuticular stitches/Dermabond.   The patient will be discharged to home as per PACU criteria.  Routine postoperative instructions given.  She was prescribed Percocet, Ibuprofen and Colace.  She will follow up in the clinic in 2 weeks for postoperative evaluation. Given lack of visualization of ectopic pregnancy, patient will return to maternity admission on 03/05/20 for repeat quant HCG

## 2020-03-03 NOTE — Anesthesia Preprocedure Evaluation (Addendum)
Anesthesia Evaluation  Patient identified by MRN, date of birth, ID band Patient awake    Reviewed: Allergy & Precautions, NPO status , Patient's Chart, lab work & pertinent test results  History of Anesthesia Complications Negative for: history of anesthetic complications  Airway Mallampati: II  TM Distance: >3 FB Neck ROM: Full    Dental no notable dental hx. (+) Dental Advisory Given   Pulmonary neg pulmonary ROS,    Pulmonary exam normal        Cardiovascular negative cardio ROS Normal cardiovascular exam     Neuro/Psych negative neurological ROS     GI/Hepatic negative GI ROS, Neg liver ROS,   Endo/Other  negative endocrine ROS  Renal/GU negative Renal ROS     Musculoskeletal negative musculoskeletal ROS (+)   Abdominal   Peds  Hematology negative hematology ROS (+)   Anesthesia Other Findings   Reproductive/Obstetrics Ectopic pregnancy                            Anesthesia Physical Anesthesia Plan  ASA: II and emergent  Anesthesia Plan: General   Post-op Pain Management:    Induction: Intravenous, Rapid sequence and Cricoid pressure planned  PONV Risk Score and Plan: 4 or greater and Ondansetron, Dexamethasone, Midazolam and Scopolamine patch - Pre-op  Airway Management Planned: Oral ETT  Additional Equipment:   Intra-op Plan:   Post-operative Plan: Extubation in OR  Informed Consent: I have reviewed the patients History and Physical, chart, labs and discussed the procedure including the risks, benefits and alternatives for the proposed anesthesia with the patient or authorized representative who has indicated his/her understanding and acceptance.     Dental advisory given  Plan Discussed with: Anesthesiologist and CRNA  Anesthesia Plan Comments:        Anesthesia Quick Evaluation

## 2020-03-03 NOTE — Discharge Instructions (Signed)
Diagnostic Laparoscopy, Care After This sheet gives you information about how to care for yourself after your procedure. Your health care provider may also give you more specific instructions. If you have problems or questions, contact your health care provider. What can I expect after the procedure? After the procedure, it is common to have:  Mild discomfort in the abdomen.  Sore throat. Women who have laparoscopy with pelvic examination may have mild cramping and fluid coming from the vagina for a few days after the procedure. Follow these instructions at home: Medicines  Take over-the-counter and prescription medicines only as told by your health care provider.  If you were prescribed an antibiotic medicine, take it as told by your health care provider. Do not stop taking the antibiotic even if you start to feel better. Driving  Do not drive for 24 hours if you were given a medicine to help you relax (sedative) during your procedure.  Do not drive or use heavy machinery while taking prescription pain medicine. Bathing  Do not take baths, swim, or use a hot tub until your health care provider approves. You may take showers. Incision care   Follow instructions from your health care provider about how to take care of your incisions. Make sure you: ? Wash your hands with soap and water before you change your bandage (dressing). If soap and water are not available, use hand sanitizer. ? Change your dressing as told by your health care provider. ? Leave stitches (sutures), skin glue, or adhesive strips in place. These skin closures may need to stay in place for 2 weeks or longer. If adhesive strip edges start to loosen and curl up, you may trim the loose edges. Do not remove adhesive strips completely unless your health care provider tells you to do that.  Check your incision areas every day for signs of infection. Check for: ? Redness, swelling, or pain. ? Fluid or  blood. ? Warmth. ? Pus or a bad smell. Activity  Return to your normal activities as told by your health care provider. Ask your health care provider what activities are safe for you.  Do not lift anything that is heavier than 10 lb (4.5 kg), or the limit that you are told, until your health care provider says that it is safe. General instructions  To prevent or treat constipation while you are taking prescription pain medicine, your health care provider may recommend that you: ? Drink enough fluid to keep your urine pale yellow. ? Take over-the-counter or prescription medicines. ? Eat foods that are high in fiber, such as fresh fruits and vegetables, whole grains, and beans. ? Limit foods that are high in fat and processed sugars, such as fried and sweet foods.  Do not use any products that contain nicotine or tobacco, such as cigarettes and e-cigarettes. If you need help quitting, ask your health care provider.  Keep all follow-up visits as told by your health care provider. This is important. Contact a health care provider if:  You develop shoulder pain.  You feel lightheaded or faint.  You are unable to pass gas or have a bowel movement.  You feel nauseous or you vomit.  You develop a rash.  You have redness, swelling, or pain around any incision.  You have fluid or blood coming from any incision.  Any incision feels warm to the touch.  You have pus or a bad smell coming from any incision.  You have a fever or chills. Get help   right away if:  You have severe pain.  You have vomiting that does not go away.  You have heavy bleeding from the vagina.  Any incision opens.  You have trouble breathing.  You have chest pain. Summary  After the procedure, it is common to have mild discomfort in the abdomen and a sore throat.  Check your incision areas every day for signs of infection.  Return to your normal activities as told by your health care provider. Ask  your health care provider what activities are safe for you. This information is not intended to replace advice given to you by your health care provider. Make sure you discuss any questions you have with your health care provider. Document Revised: 02/09/2017 Document Reviewed: 08/23/2016 Elsevier Patient Education  2020 [employer]  

## 2020-03-03 NOTE — Transfer of Care (Signed)
Immediate Anesthesia Transfer of Care Note  Patient: Emily Parsons  Procedure(s) Performed: DIAGNOSTIC LAPAROSCOPY WITH PERITONEAL SAMPLING  Patient Location: PACU  Anesthesia Type:General  Level of Consciousness: sedated  Airway & Oxygen Therapy: Patient Spontanous Breathing  Post-op Assessment: Report given to RN and Post -op Vital signs reviewed and stable  Post vital signs: Reviewed and stable  Last Vitals:  Vitals Value Taken Time  BP 120/73 03/03/20 0455  Temp    Pulse 67 03/03/20 0455  Resp 13 03/03/20 0455  SpO2 100 % 03/03/20 0455  Vitals shown include unvalidated device data.  Last Pain:  Vitals:   03/03/20 0308  TempSrc: Axillary  PainSc:          Complications: No complications documented.

## 2020-03-03 NOTE — Anesthesia Procedure Notes (Signed)
Procedure Name: Intubation Date/Time: 03/03/2020 3:47 AM Performed by: Molli Hazard, CRNA Pre-anesthesia Checklist: Patient identified, Emergency Drugs available, Suction available and Patient being monitored Patient Re-evaluated:Patient Re-evaluated prior to induction Oxygen Delivery Method: Circle system utilized Preoxygenation: Pre-oxygenation with 100% oxygen Induction Type: IV induction, Rapid sequence and Cricoid Pressure applied Laryngoscope Size: Miller and 2 Grade View: Grade I Tube type: Oral Tube size: 7.5 mm Number of attempts: 1 Airway Equipment and Method: Stylet Placement Confirmation: ETT inserted through vocal cords under direct vision,  positive ETCO2 and breath sounds checked- equal and bilateral Secured at: 19 cm Tube secured with: Tape Dental Injury: Teeth and Oropharynx as per pre-operative assessment

## 2020-03-04 ENCOUNTER — Encounter (HOSPITAL_COMMUNITY): Payer: Self-pay | Admitting: Obstetrics and Gynecology

## 2020-03-04 LAB — SURGICAL PATHOLOGY

## 2020-03-05 ENCOUNTER — Inpatient Hospital Stay (HOSPITAL_COMMUNITY)
Admission: AD | Admit: 2020-03-05 | Discharge: 2020-03-05 | Disposition: A | Discharge status: Home / Self Care | Payer: BC Managed Care – PPO | Attending: Obstetrics and Gynecology | Admitting: Obstetrics and Gynecology

## 2020-03-05 ENCOUNTER — Other Ambulatory Visit: Payer: Self-pay

## 2020-03-05 DIAGNOSIS — O3680X0 Pregnancy with inconclusive fetal viability, not applicable or unspecified: Secondary | ICD-10-CM | POA: Diagnosis not present

## 2020-03-05 DIAGNOSIS — Z3A01 Less than 8 weeks gestation of pregnancy: Secondary | ICD-10-CM | POA: Diagnosis not present

## 2020-03-05 DIAGNOSIS — O009 Unspecified ectopic pregnancy without intrauterine pregnancy: Secondary | ICD-10-CM | POA: Insufficient documentation

## 2020-03-05 DIAGNOSIS — Z6790 Unspecified blood type, Rh positive: Secondary | ICD-10-CM

## 2020-03-05 LAB — HCG, QUANTITATIVE, PREGNANCY: hCG, Beta Chain, Quant, S: 891 m[IU]/mL — ABNORMAL HIGH (ref <5)

## 2020-03-05 NOTE — Discharge Instructions (Signed)
Ectopic Pregnancy ° °An ectopic pregnancy is when the fertilized egg attaches (implants) outside the uterus. Most ectopic pregnancies occur in one of the tubes where eggs travel from the ovary to the uterus (fallopian tubes), but the implanting can occur in other locations. In rare cases, ectopic pregnancies occur on the ovary, intestine, pelvis, abdomen, or cervix. In an ectopic pregnancy, the fertilized egg does not have the ability to develop into a normal, healthy baby. °A ruptured ectopic pregnancy is one in which tearing or bursting of a fallopian tube causes internal bleeding. Often, there is intense lower abdominal pain, and vaginal bleeding sometimes occurs. Having an ectopic pregnancy can be life-threatening. If this dangerous condition is not treated, it can lead to blood loss, shock, or even death. °What are the causes? °The most common cause of this condition is damage to one of the fallopian tubes. A fallopian tube may be narrowed or blocked, and that keeps the fertilized egg from reaching the uterus. °What increases the risk? °This condition is more likely to develop in women of childbearing age who have different levels of risk. The levels of risk can be divided into three categories. °High risk °· You have gone through infertility treatment. °· You have had an ectopic pregnancy before. °· You have had surgery on the fallopian tubes, or another surgical procedure, such as an abortion. °· You have had surgery to have the fallopian tubes tied (tubal ligation). °· You have problems or diseases of the fallopian tubes. °· You have been exposed to diethylstilbestrol (DES). This medicine was used until 1971, and it had effects on babies whose mothers took the medicine. °· You become pregnant while using an IUD (intrauterine device) for birth control. °Moderate risk °· You have a history of infertility. °· You have had an STI (sexually transmitted infection). °· You have a history of pelvic inflammatory  disease (PID). °· You have scarring from endometriosis. °· You have multiple sexual partners. °· You smoke. °Low risk °· You have had pelvic surgery. °· You use vaginal douches. °· You became sexually active before age 18. °What are the signs or symptoms? °Common symptoms of this condition include normal pregnancy symptoms, such as missing a period, nausea, tiredness, abdominal pain, breast tenderness, and bleeding. However, ectopic pregnancy will have additional symptoms, such as: °· Pain with intercourse. °· Irregular vaginal bleeding or spotting. °· Cramping or pain on one side or in the lower abdomen. °· Fast heartbeat, low blood pressure, and sweating. °· Passing out while having a bowel movement. °Symptoms of a ruptured ectopic pregnancy and internal bleeding may include: °· Sudden, severe pain in the abdomen and pelvis. °· Dizziness, weakness, light-headedness, or fainting. °· Pain in the shoulder or neck area. °How is this diagnosed? °This condition is diagnosed by: °· A pelvic exam to locate pain or a mass in the abdomen. °· A pregnancy test. This blood test checks for the presence as well as the specific level of pregnancy hormone in the bloodstream. °· Ultrasound. This is performed if a pregnancy test is positive. In this test, a probe is inserted into the vagina. The probe will detect a fetus, possibly in a location other than the uterus. °· Taking a sample of uterus tissue (dilation and curettage, or D&C). °· Surgery to perform a visual exam of the inside of the abdomen using a thin, lighted tube that has a tiny camera on the end (laparoscope). °· Culdocentesis. This procedure involves inserting a needle at the top of   the vagina, behind the uterus. If blood is present in this area, it may indicate that a fallopian tube is torn. How is this treated? This condition is treated with medicine or surgery. Medicine  An injection of a medicine (methotrexate) may be given to cause the pregnancy tissue to be  absorbed. This medicine may save your fallopian tube. It may be given if: ? The diagnosis is made early, with no signs of active bleeding. ? The fallopian tube has not ruptured. ? You are considered to be a good candidate for the medicine. Usually, pregnancy hormone blood levels are checked after methotrexate treatment. This is to be sure that the medicine is effective. It may take 4-6 weeks for the pregnancy to be absorbed. Most pregnancies will be absorbed by 3 weeks. Surgery  A laparoscope may be used to remove the pregnancy tissue.  If severe internal bleeding occurs, a larger cut (incision) may be made in the lower abdomen (laparotomy) to remove the fetus and placenta. This is done to stop the bleeding.  Part or all of the fallopian tube may be removed (salpingectomy) along with the fetus and placenta. The fallopian tube may also be repaired during the surgery.  In very rare circumstances, removal of the uterus (hysterectomy) may be required.  After surgery, pregnancy hormone testing may be done to be sure that there is no pregnancy tissue left. Whether your treatment is medicine or surgery, you may receive a Rho (D) immune globulin shot to prevent problems with any future pregnancy. This shot may be given if:  You are Rh-negative and the baby's father is Rh-positive.  You are Rh-negative and you do not know the Rh type of the baby's father. Follow these instructions at home:  Rest and limit your activity after the procedure for as long as told by your health care provider.  Until your health care provider says that it is safe: ? Do not lift anything that is heavier than 10 lb (4.5 kg), or the limit that your health care provider tells you. ? Avoid physical exercise and any movement that requires effort (is strenuous).  To help prevent constipation: ? Eat a healthy diet that includes fruits, vegetables, and whole grains. ? Drink 6-8 glasses of water per day. Get help right away  if:  You develop worsening pain that is not relieved by medicine.  You have: ? A fever or chills. ? Vaginal bleeding. ? Redness and swelling at the incision site. ? Nausea and vomiting.  You feel dizzy or weak.  You feel light-headed or you faint. This information is not intended to replace advice given to you by your health care provider. Make sure you discuss any questions you have with your health care provider. Document Revised: 02/09/2017 Document Reviewed: 09/29/2015 Elsevier Patient Education  [employer].         Ruptured Ectopic Pregnancy  An ectopic pregnancy is when a fertilized egg attaches (implants) outside of the uterus, usually in a fallopian tube. A ruptured ectopic pregnancy is when the fallopian tube tears or bursts. This results in internal bleeding, intense abdominal pain, and sometimes, vaginal bleeding. Most ectopic pregnancies occur in the fallopian tube. In rare cases, it may occur on the ovary, intestine, pelvis, or cervix. An ectopic pregnancy does not have the ability to develop into a normal, healthy baby. A ruptured ectopic pregnancy can affect your ability to have children (fertility), depending on damage it causes to your reproductive organs. Ruptured ectopic pregnancy is a  medical emergency. If not treated immediately, it can lead to blood loss, shock, or even death. What are the causes? Most ectopic pregnancies are caused by damage to the fallopian tubes. The damage prevents the fertilized egg from implanting in the uterus. In some cases, the cause may not be known. What increases the risk? You are at increased risk for an ectopic pregnancy if:  You have had a previous ectopic pregnancy.  You have had previous fallopian tube surgery.  You have had previous surgery to have the fallopian tubes tied (tubal ligation).  You have had infertility treatments or have a history of infertility.  You have been exposed to DES. DES is a medicine  that was used until 1971 and had effects on babies whose mothers took the medicine.  You use an IUD (intrauterine device) for birth control.  You use progestin-only oral contraception for birth control.  You have a history of pelvic inflammatory disease (PID).  You have a history of endometriosis.  You smoke.  You became sexually active before 30 years of age.  You have multiple sexual partners. What are the signs or symptoms? Symptoms of a ruptured ectopic pregnancy and internal bleeding may include:  Sudden, severe pain in the abdomen and pelvis.  Dizziness or fainting.  Pain in the shoulder area.  Vaginal bleeding. How is this diagnosed? This condition is diagnosed based on your medical history, symptoms, a physical exam, and tests, which may include:  A pregnancy test.  An ultrasound.  Measuring the levels of the pregnancy hormone in the bloodstream.  Taking a sample of tissue from the uterus (dilation and curettage, D&C).  Surgery to visually examine the inside of the abdomen using a lighted tube (laparoscopy). How is this treated? This condition is treated with IV fluids and emergency surgery to remove the ectopic pregnancy and repair the area where the rupture occured. If you have lost a lot of blood, you may need a blood transfusion. If you are Rh negative and your baby's father is Rh positive, or the Rh type of the father is unknown, you may receive a Rho (D) immune globulin shot. This is to prevent Rh problems in future pregnancies. Additional medicines may be given. Get help right away if:  You are taking medicines to treat an ectopic pregnancy and you develop symptoms of a rupture. These include: ? Fever or chills. ? Shoulder pain. ? Vaginal bleeding. ? Nausea and vomiting. ? Severe abdominal pain or cramping. ? Feeling light-headed or fainting. Summary  An ectopic pregnancy is when a fertilized egg attaches (implants) outside of the uterus, usually in  a fallopian tube. A ruptured ectopic pregnancy is when the fallopian tube tears or bursts.  Ruptured ectopic pregnancy is a medical emergency. If not treated immediately, it can lead to blood loss, shock, or even death.  This condition is treated with IV fluids and emergency surgery to remove the ectopic pregnancy and repair the area where the rupture occured. If you have lost a lot of blood, you may need a blood transfusion. This information is not intended to replace advice given to you by your health care provider. Make sure you discuss any questions you have with your health care provider. Document Revised: 02/09/2017 Document Reviewed: 05/17/2016 Elsevier Patient Education  2020 [employer].

## 2020-03-05 NOTE — MAU Provider Note (Signed)
Subjective:  Emily Parsons is a 29 y.o. G3P1001 at [redacted]w[redacted]d who presents today for FU BHCG. She was seen on 03/02/2020 and 03/03/2020. Results from that day show no IUP on [place], hemoperitoneum resulting in subsequent emergency surgery and HCG 7705. She reports brown discharge with wiping only. She reports abdominal soreness at umbilical incisional site but denies pelvic pain.   Objective:  Physical Exam  Nursing note and vitals reviewed.  Patient Vitals for the past 24 hrs:  BP Temp Temp src Pulse Resp  03/05/20 1223 109/69 98.3 F (36.8 C) Oral 60 16   Constitutional: She is oriented to person, place, and time. She appears well-developed and well-nourished. No distress.  HENT:  Head: Normocephalic.  Cardiovascular: Normal rate.  Respiratory: Effort normal.  GI: Soft. There is no tenderness.  Neurological: She is alert and oriented to person, place, and time. Skin: Skin is warm and dry.  Psychiatric: She has a normal mood and affect.   Results for orders placed or performed during the hospital encounter of 03/05/20 (from the past 24 hour(s))  hCG, quantitative, pregnancy     Status: Abnormal   Collection Time: 03/05/20 10:41 AM  Result Value Ref Range   hCG, Beta Chain, Quant, S 891 (H) <5 mIU/mL   Assessment/Plan: Pregnancy of unknown location, suspect ectopic HCG did drop appropriately Consulted with Dr. Donavan Foil who agrees patient can be discharged home without additional intervention and recommends f/u appt in one week for hCG only FU in 1 weeks for: repeat hCG at [facility] 03/10/2020 @8AM  (pt originally scheduled 03/11/2020, but states she will be out of town that day) Ectopic precautions reviewed Pt discharged to home in stable condition  Shaianne Nucci, 03/13/2020, NP  12:28 PM 03/05/2020

## 2020-03-05 NOTE — MAU Note (Signed)
Had surgery 12/21 for ectopic, here for f/u HCG level per Dr Jolayne Panther.  States doing ok, pain meds help, "just some minor tenderness at belly button".  Small amt of bleeding.

## 2020-03-10 ENCOUNTER — Other Ambulatory Visit: Payer: BC Managed Care – PPO

## 2020-03-10 ENCOUNTER — Other Ambulatory Visit: Payer: Self-pay

## 2020-03-10 ENCOUNTER — Telehealth: Payer: Self-pay

## 2020-03-10 DIAGNOSIS — O009 Unspecified ectopic pregnancy without intrauterine pregnancy: Secondary | ICD-10-CM

## 2020-03-10 LAB — BETA HCG QUANT (REF LAB): hCG Quant: 101 m[IU]/mL

## 2020-03-10 NOTE — Progress Notes (Signed)
Pt presents for Non Stat HCG.  Pt notes light spotting and soreness from surgery. Pt advised on precautions and voiced understanding.

## 2020-03-10 NOTE — Progress Notes (Signed)
Patient was assessed and managed by nursing staff during this encounter. I have reviewed the chart and agree with the documentation and plan. I have also made any necessary editorial changes.  Catricia Scheerer A Breelyn Icard, MD 03/10/2020 8:29 AM   

## 2020-03-10 NOTE — Telephone Encounter (Signed)
TC to pt to make aware of HCG result today.  Pt already has appt next week w/Dr.Constant pt advised to keep appt and will repeat HCG after her visit next week Pt agreeable and voiced understanding.

## 2020-03-11 ENCOUNTER — Other Ambulatory Visit: Payer: BC Managed Care – PPO

## 2020-03-18 ENCOUNTER — Ambulatory Visit (INDEPENDENT_AMBULATORY_CARE_PROVIDER_SITE_OTHER): Payer: Medicaid Other | Admitting: Obstetrics and Gynecology

## 2020-03-18 ENCOUNTER — Other Ambulatory Visit: Payer: Self-pay

## 2020-03-18 ENCOUNTER — Encounter: Payer: Self-pay | Admitting: Obstetrics and Gynecology

## 2020-03-18 VITALS — BP 107/65 | HR 64 | Wt 139.0 lb

## 2020-03-18 DIAGNOSIS — O008 Other ectopic pregnancy without intrauterine pregnancy: Secondary | ICD-10-CM

## 2020-03-18 DIAGNOSIS — Z9889 Other specified postprocedural states: Secondary | ICD-10-CM

## 2020-03-18 NOTE — Progress Notes (Signed)
30 yo P1011 here for post op check s/p diagnostic laparoscopy for ectopic pregnancy on 03/03/20. Patient reports feeling well and denies any pain. She has returned to her regular activities. Patient denies any fever, or abnormal drainage from her incisions  Past Medical History:  Diagnosis Date  . Depression    Past Surgical History:  Procedure Laterality Date  . DIAGNOSTIC LAPAROSCOPY WITH REMOVAL OF ECTOPIC PREGNANCY  03/03/2020   Procedure: DIAGNOSTIC LAPAROSCOPY WITH PERITONEAL SAMPLING;  Surgeon: Catalina Antigua, MD;  Location: [facility] OR;  Service: Gynecology;;   History reviewed. No pertinent family history. Social History   Tobacco Use  . Smoking status: Never Smoker  . Smokeless tobacco: Never Used  Substance Use Topics  . Alcohol use: Yes  . Drug use: Never   ROS See pertinent in HPI. All other systems were reviewed and non contributory  Blood pressure 107/65, pulse 64, weight 139 lb (63 kg), last menstrual period 02/13/2020, not currently breastfeeding. GENERAL: Well-developed, well-nourished female in no acute distress.  LUNGS: Clear to auscultation bilaterally.  HEART: Regular rate and rhythm. ABDOMEN: Soft, nontender, nondistended. No organomegaly. Incision: healed x 3 without erythema, induration or drainage EXTREMITIES: No cyanosis, clubbing, or edema, 2+ distal pulses.  A/P 30 yo here for post op check s/p diagnostic laparoscopy for ectopic pregnancy - Patient is medically cleared to resume all activities - Reviewed operative findings and pathology results - Quant HCG today and weekly until negative - Continue prenatal vitamins - Patient advised to use condoms until negative quant HCG and she is free to attempt to conceive thereafter - Patient advised to seek early prenatal care with next pregnancy to identify pregnancy location - RTC prn

## 2020-03-18 NOTE — Progress Notes (Signed)
GYN presents for Post Op FU for Ectopic Pregnancy.  PHQ-9=0

## 2020-03-19 LAB — BETA HCG QUANT (REF LAB): hCG Quant: 11 m[IU]/mL

## 2020-03-26 ENCOUNTER — Other Ambulatory Visit: Payer: Self-pay

## 2020-03-26 ENCOUNTER — Ambulatory Visit: Payer: Medicaid Other

## 2020-03-26 DIAGNOSIS — O008 Other ectopic pregnancy without intrauterine pregnancy: Secondary | ICD-10-CM

## 2020-03-26 NOTE — Progress Notes (Signed)
Pt is in the office for HCG re-check following an ectopic pregnancy. Pt denies any bleeding or pain today.

## 2020-03-26 NOTE — Progress Notes (Signed)
Agree with A & P. 

## 2020-03-27 LAB — BETA HCG QUANT (REF LAB): hCG Quant: 1 m[IU]/mL

## 2020-06-17 ENCOUNTER — Ambulatory Visit: Payer: Medicaid Other | Admitting: Obstetrics and Gynecology

## 2022-07-08 IMAGING — US US OB < 14 WEEKS - US OB TV
1 series · 15 of 28 positions shown · non-contrast
Comparison: None.

CLINICAL DATA: Vaginal bleeding.

EXAM:
OBSTETRIC <14 WK US AND TRANSVAGINAL OB US
TECHNIQUE: Both transabdominal and transvaginal ultrasound examinations were
performed for complete evaluation of the gestation as well as the
maternal uterus, adnexal regions, and pelvic cul-de-sac.
Transvaginal technique was performed to assess early pregnancy.

[Series 1: us ob < 14 weeks - us ob tv · 65 acquisitions, 15 frames shown]
[im 1/65]
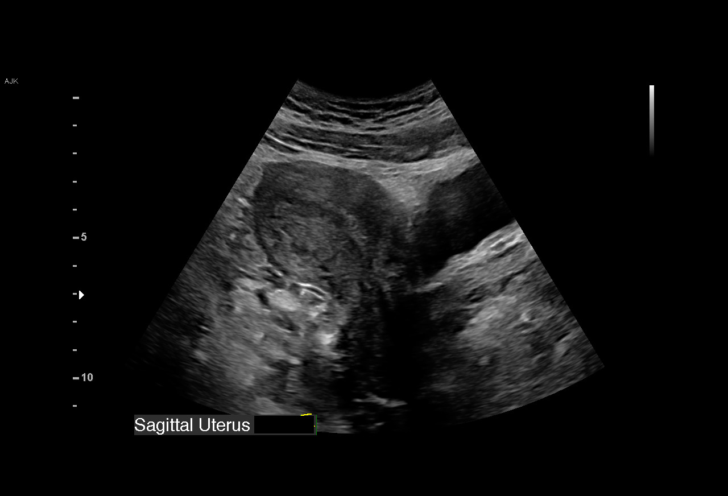
[im 5/65]
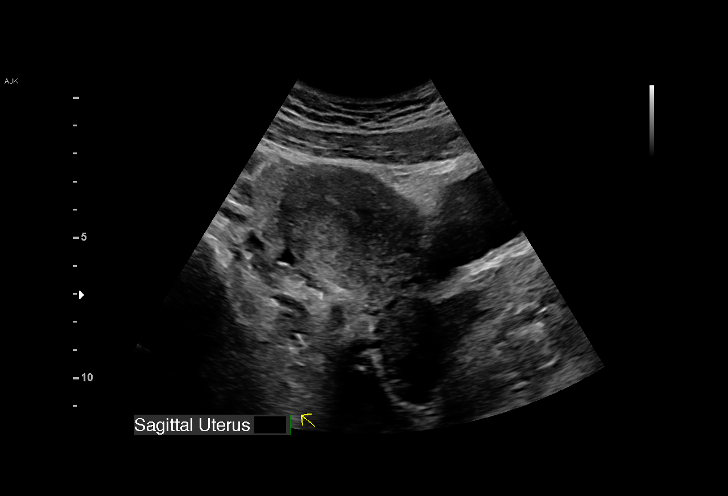
[im 10/65]
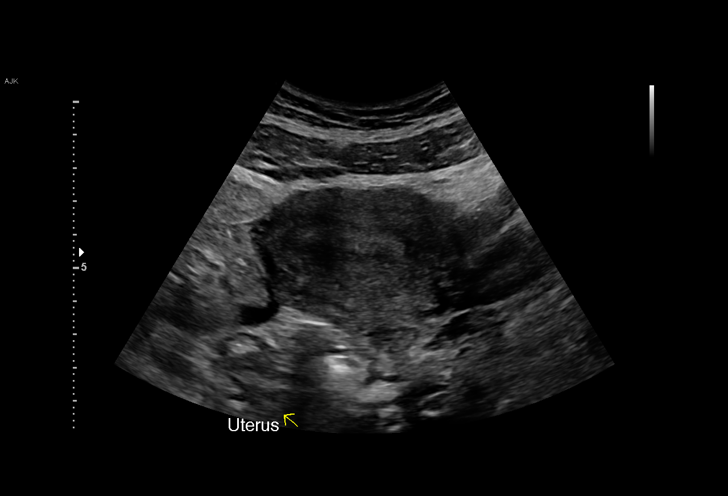
[im 15/65]
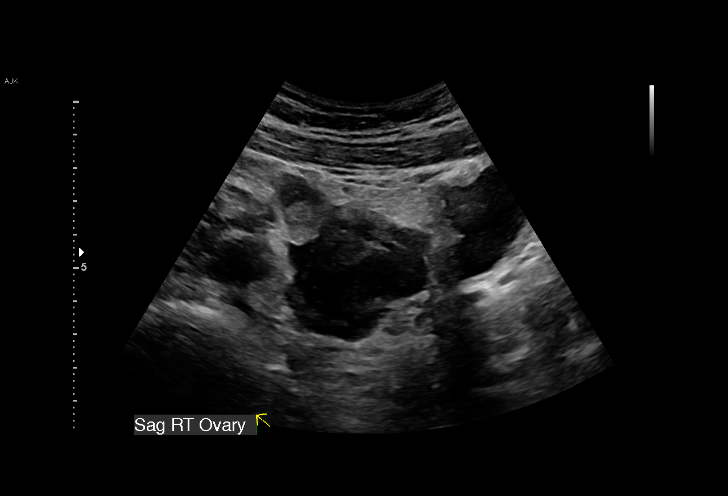
[im 19/65]
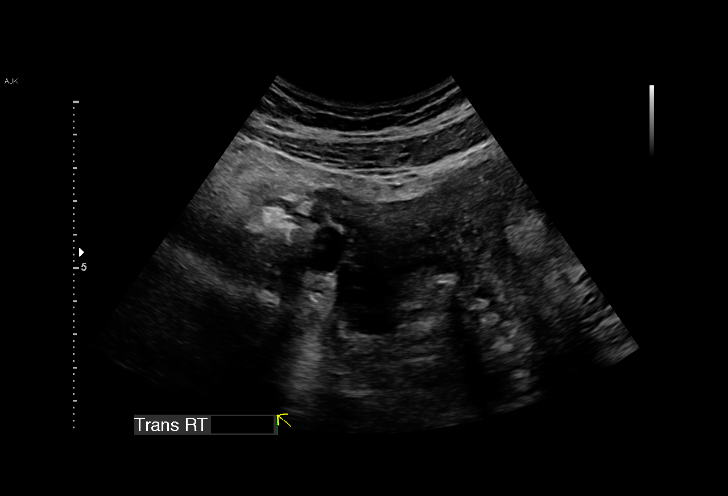
[im 24/65]
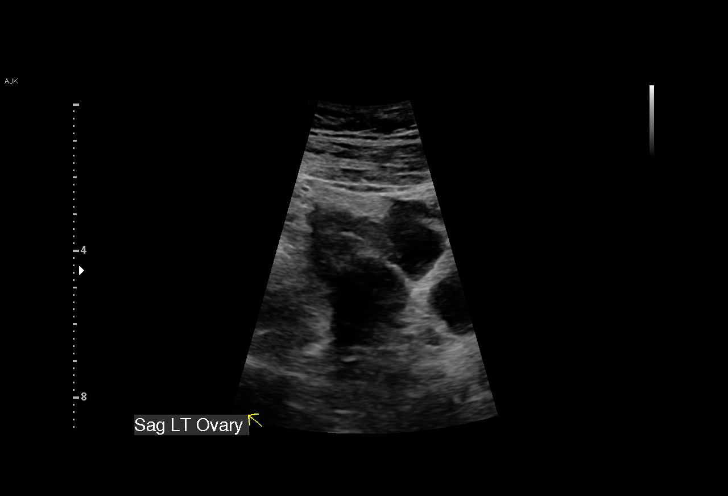
[im 29/65]
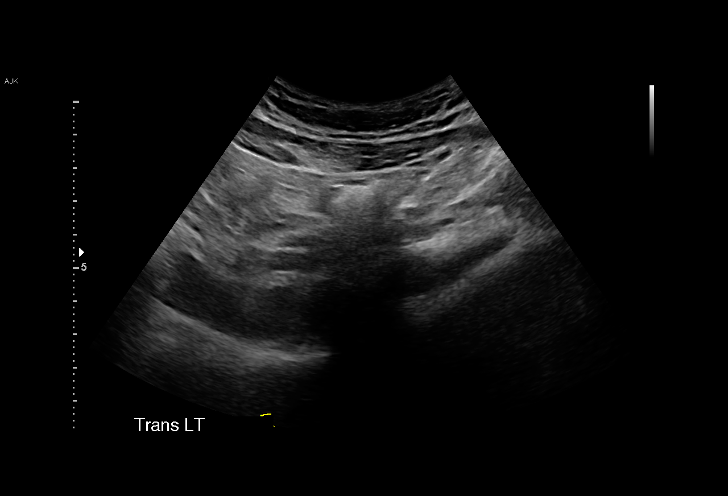
[im 34/65]
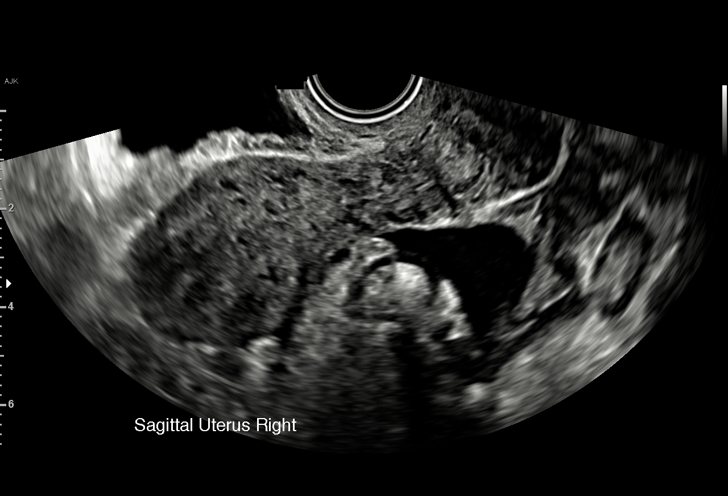
[im 36/65]
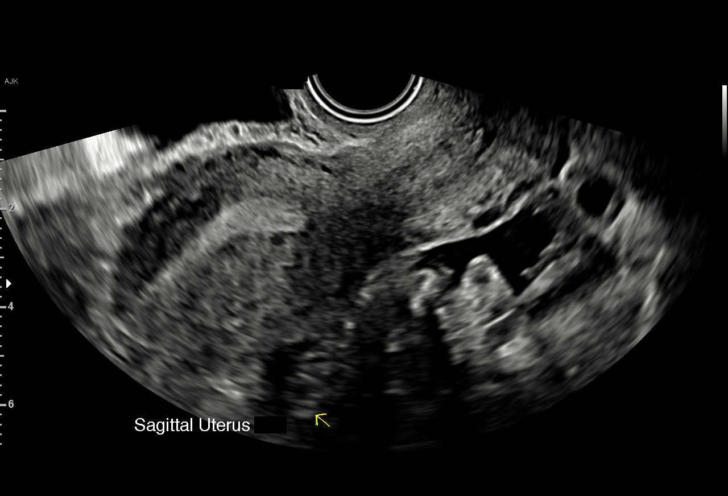
[im 41/65]
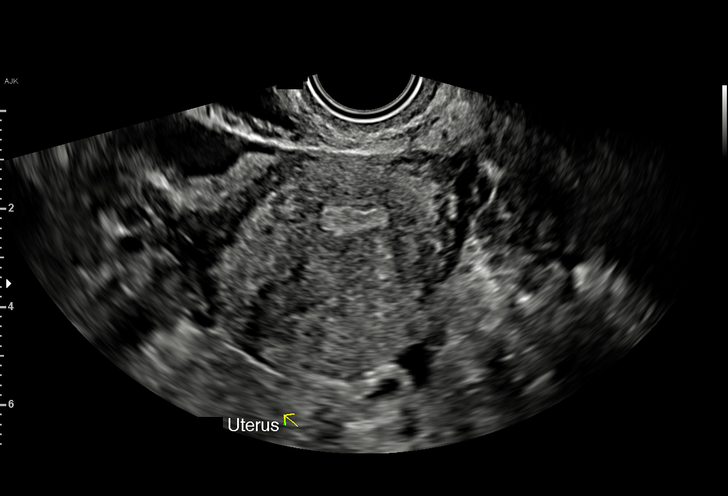
[im 46/65]
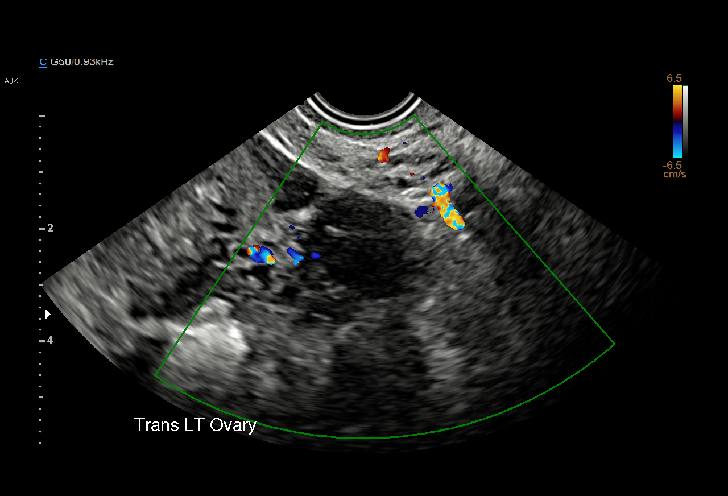
[im 50/65]
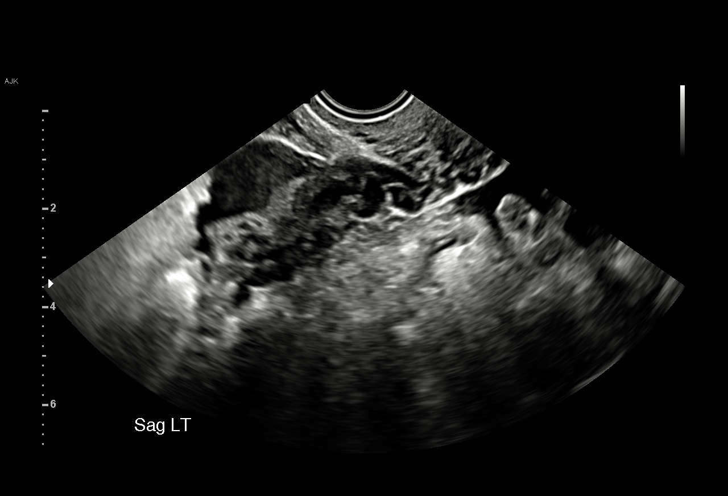
[im 55/65]
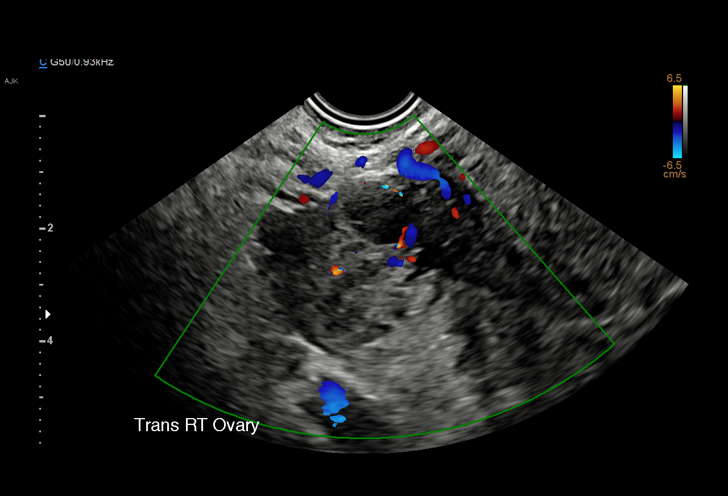
[im 60/65]
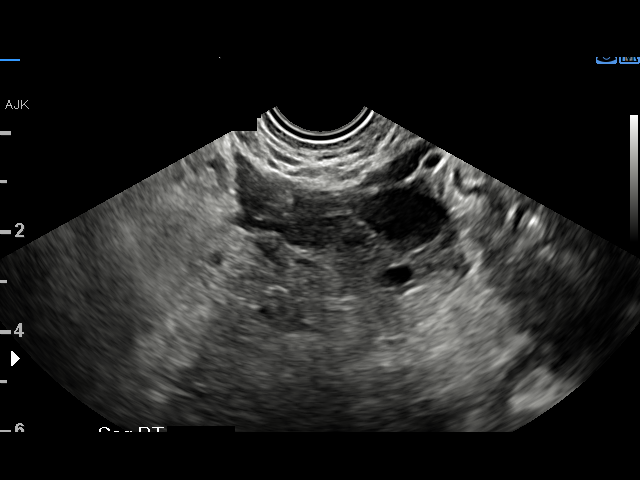
[im 65/65]
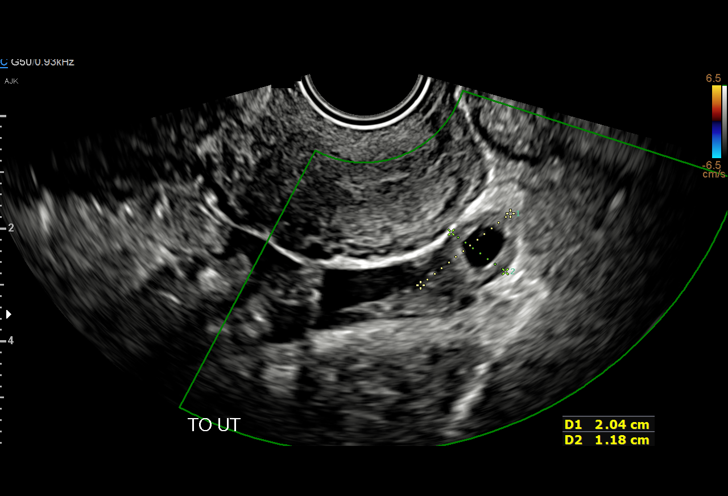

[15 of 28 positions shown; findings below may reference images not displayed]

FINDINGS: Intrauterine gestational sac: None

Yolk sac:  Not Visualized.

Embryo:  Not Visualized.

Cardiac Activity: Not Visualized.

Heart Rate: N/A  bpm

Subchorionic hemorrhage:  None visualized.

Maternal uterus/adnexae:

A 5.4 cm x 1.4 cm complex-appearing area of heterogeneous
hypoechogenicity is seen posterior to the uterus. This area
demonstrates no flow on color Doppler evaluation and contains a
cm x 1.2 cm component of heterogeneous echogenicity with a
well-defined anechoic structure. No pain is experienced by the
patient during the examination of this area, as per the ultrasound
technologist.

The right ovary measures 3.7 cm x 4.4 cm x 3.0 cm and is
heterogeneous in appearance. The patient experienced an extensive
amount of pain during ultrasonographic examination of this region.

The left ovary measures 2.7 cm x 3.7 cm x 2.7 cm and is normal in
appearance.

A small to moderate amount of pelvic free fluid is seen.
IMPRESSION: 1. No evidence of an intrauterine pregnancy.
2. Complex area of heterogeneous echogenicity posterior to the
uterus which is suspicious for the presence of blood products
related to an ectopic pregnancy. Correlation with follow-up pelvic
ultrasound and serial beta HCG levels is recommended.

## 2022-07-18 DIAGNOSIS — F411 Generalized anxiety disorder: Secondary | ICD-10-CM | POA: Diagnosis not present

## 2022-07-25 DIAGNOSIS — F411 Generalized anxiety disorder: Secondary | ICD-10-CM | POA: Diagnosis not present

## 2022-08-01 DIAGNOSIS — F411 Generalized anxiety disorder: Secondary | ICD-10-CM | POA: Diagnosis not present

## 2022-08-09 DIAGNOSIS — F411 Generalized anxiety disorder: Secondary | ICD-10-CM | POA: Diagnosis not present

## 2023-09-12 ENCOUNTER — Ambulatory Visit (INDEPENDENT_AMBULATORY_CARE_PROVIDER_SITE_OTHER): Payer: Self-pay | Admitting: Family Medicine

## 2023-09-12 DIAGNOSIS — Z91199 Patient's noncompliance with other medical treatment and regimen due to unspecified reason: Secondary | ICD-10-CM

## 2023-09-12 DIAGNOSIS — Z0000 Encounter for general adult medical examination without abnormal findings: Secondary | ICD-10-CM

## 2023-09-13 NOTE — Progress Notes (Signed)
 No show

## 2024-04-17 ENCOUNTER — Encounter: Payer: Self-pay | Admitting: Nurse Practitioner

## 2024-04-17 ENCOUNTER — Ambulatory Visit (INDEPENDENT_AMBULATORY_CARE_PROVIDER_SITE_OTHER): Admitting: Nurse Practitioner

## 2024-04-17 VITALS — BP 112/61 | HR 64 | Temp 98.2°F | Ht 67.0 in | Wt 146.2 lb

## 2024-04-17 DIAGNOSIS — Z124 Encounter for screening for malignant neoplasm of cervix: Secondary | ICD-10-CM | POA: Diagnosis not present

## 2024-04-17 DIAGNOSIS — F332 Major depressive disorder, recurrent severe without psychotic features: Secondary | ICD-10-CM

## 2024-04-17 DIAGNOSIS — Z13220 Encounter for screening for lipoid disorders: Secondary | ICD-10-CM | POA: Diagnosis not present

## 2024-04-17 DIAGNOSIS — K529 Noninfective gastroenteritis and colitis, unspecified: Secondary | ICD-10-CM

## 2024-04-17 DIAGNOSIS — M2550 Pain in unspecified joint: Secondary | ICD-10-CM

## 2024-04-17 DIAGNOSIS — R5383 Other fatigue: Secondary | ICD-10-CM

## 2024-04-17 DIAGNOSIS — E559 Vitamin D deficiency, unspecified: Secondary | ICD-10-CM

## 2024-04-17 DIAGNOSIS — Z1329 Encounter for screening for other suspected endocrine disorder: Secondary | ICD-10-CM

## 2024-04-17 NOTE — Progress Notes (Signed)
 "  Subjective   Patient ID: Emily Parsons, female    DOB: 08/07/1990, 34 y.o.   MRN: [id number]  Chief Complaint  Patient presents with   digestive issues    Bloating, diarrhea, and pain x 6 months.    New Patient (Initial Visit)    Referring provider: No ref. provider found  Emily Parsons is a 34 y.o. female with Past Medical History: No date: Depression   HPI  Patient presents today to establish care.  She has multiple complaints today:  Patient states that she has been having stomach issues.  She states that she does have chronic diarrhea which does switch over and turn to constipation at times.  This has been an ongoing issue for years.  She states that her food looks exactly the same and her bowel movements and appears undigested.  Patient has tried diet changes with minimal relief noted.  We will place a referral for her for GI for further evaluation.  Patient does complain today of insomnia and chronic fatigue.  She states that this has been an ongoing issue.  Even when she does sleep she feels like she does not get a good nights rest.  She does take melatonin and THC Gummies.  Patient also complains of multiple joint pain.  She is concerned for autoimmune disorder.  She states that lupus does run in her family.  We will check autoimmune levels today.  Patient also complains of severe depression.  She is not feeling suicidal but does feel at times that she would be better off dead.  She is currently on Wellbutrin.  She is not seeing a psychiatrist and we will place a referral for her for an urgent visit today.  We did give her handout for the behavioral health walk-in clinic if needed.  She does have her partner with her today for support.  She does have a history of adult ADHD as well.  She does need reevaluation and medication management for this as well.  Referral to psychiatry has been placed.   Denies f/c/s, n/v/d, hemoptysis, PND, leg swelling Denies chest pain or  edema     Allergies[1]  Immunization History  Administered Date(s) Administered   Dtap, Unspecified 07/02/1990, 08/28/1990, 10/23/1990, 10/07/1991, 11/03/1993   HIB, Unspecified 07/02/1990, 08/28/1990, 10/07/1991   MMR 10/07/1991, 11/03/1993   PFIZER(Purple Top)SARS-COV-2 Vaccination 05/15/2019, 06/06/2019, 01/21/2020   Polio, Unspecified 07/02/1990, 08/28/1990, 10/07/1991, 11/03/1993   Tdap 08/29/2015    Tobacco History: Tobacco Use History[2] Counseling given: Not Answered   Outpatient Encounter Medications as of 04/17/2024  Medication Sig   buPROPion (WELLBUTRIN XL) 300 MG 24 hr tablet Take 300 mg by mouth daily.   ibuprofen  (ADVIL ) 600 MG tablet Take 1 tablet (600 mg total) by mouth every 6 (six) hours as needed.   docusate sodium  (COLACE) 100 MG capsule Take 1 capsule (100 mg total) by mouth 2 (two) times daily as needed. (Patient not taking: Reported on 04/17/2024)   oxyCODONE -acetaminophen  (PERCOCET/ROXICET) 5-325 MG tablet Take 1 tablet by mouth every 6 (six) hours as needed. (Patient not taking: Reported on 04/17/2024)   sertraline (ZOLOFT) 25 MG tablet Take 25 mg by mouth daily. (Patient not taking: Reported on 04/17/2024)   triamcinolone cream (KENALOG) 0.1 % Apply 1 Application topically 2 (two) times daily. (Patient not taking: Reported on 04/17/2024)   venlafaxine XR (EFFEXOR-XR) 150 MG 24 hr capsule Take 1 capsule by mouth daily. (Patient not taking: Reported on 04/17/2024)   No facility-administered encounter medications  on file as of 04/17/2024.    Review of Systems  Review of Systems  Constitutional: Negative.   HENT: Negative.    Cardiovascular: Negative.   Gastrointestinal:  Positive for constipation and diarrhea.  Musculoskeletal:  Positive for arthralgias.  Allergic/Immunologic: Negative.   Neurological: Negative.   Psychiatric/Behavioral: Negative.       Objective:   BP 112/61   Pulse 64   Temp 98.2 F (36.8 C) (Temporal)   Ht 5' 7 (1.702 m) Comment: Pt  reported  Wt 146 lb 3.2 oz (66.3 kg)   SpO2 100%   BMI 22.90 kg/m   Wt Readings from Last 5 Encounters:  04/17/24 146 lb 3.2 oz (66.3 kg)  03/18/20 139 lb (63 kg)  03/02/20 139 lb 9.6 oz (63.3 kg)     Physical Exam Vitals and nursing note reviewed.  Constitutional:      General: She is not in acute distress.    Appearance: She is well-developed.  Cardiovascular:     Rate and Rhythm: Normal rate and regular rhythm.  Pulmonary:     Effort: Pulmonary effort is normal.     Breath sounds: Normal breath sounds.  Neurological:     Mental Status: She is alert and oriented to person, place, and time.       Assessment & Plan:   Chronic diarrhea -     Ambulatory referral to Gastroenterology  Severe episode of recurrent major depressive disorder, without psychotic features (HCC) -     Ambulatory referral to Psychiatry  Other fatigue -     CBC -     Comprehensive metabolic panel with GFR  Lipid screening -     Lipid panel  Vitamin D deficiency -     VITAMIN D 25 Hydroxy (Vit-D Deficiency, Fractures)  Cervical cancer screening -     Ambulatory referral to Obstetrics / Gynecology  Thyroid disorder screen -     TSH  Arthralgia, unspecified joint -     ANA -     Rheumatoid factor -     Sedimentation rate -     C-reactive protein     Return in about 3 months (around 07/15/2024).     Bascom GORMAN Borer, NP 04/17/2024     [1] No Known Allergies [2]  Social History Tobacco Use  Smoking Status Never  Smokeless Tobacco Never   "

## 2024-04-18 ENCOUNTER — Ambulatory Visit: Admitting: Nurse Practitioner

## 2024-04-18 LAB — LIPID PANEL
Chol/HDL Ratio: 3.6 ratio (ref 0.0–4.4)
Cholesterol, Total: 283 mg/dL — ABNORMAL HIGH (ref 100–199)
HDL: 79 mg/dL
LDL Chol Calc (NIH): 190 mg/dL — ABNORMAL HIGH (ref 0–99)
Triglycerides: 87 mg/dL (ref 0–149)
VLDL Cholesterol Cal: 14 mg/dL (ref 5–40)

## 2024-04-18 LAB — CBC
Hematocrit: 43 % (ref 34.0–46.6)
Hemoglobin: 14.2 g/dL (ref 11.1–15.9)
MCH: 30.7 pg (ref 26.6–33.0)
MCHC: 33 g/dL (ref 31.5–35.7)
MCV: 93 fL (ref 79–97)
Platelets: 220 10*3/uL (ref 150–450)
RBC: 4.63 x10E6/uL (ref 3.77–5.28)
RDW: 12.6 % (ref 11.7–15.4)
WBC: 5.9 10*3/uL (ref 3.4–10.8)

## 2024-04-18 LAB — TSH: TSH: 1.19 u[IU]/mL (ref 0.450–4.500)

## 2024-04-18 LAB — VITAMIN D 25 HYDROXY (VIT D DEFICIENCY, FRACTURES): Vit D, 25-Hydroxy: 33.5 ng/mL (ref 30.0–100.0)

## 2024-04-18 LAB — COMPREHENSIVE METABOLIC PANEL WITH GFR
ALT: 14 [IU]/L (ref 0–32)
AST: 19 [IU]/L (ref 0–40)
Albumin: 4.6 g/dL (ref 3.9–4.9)
Alkaline Phosphatase: 45 [IU]/L (ref 41–116)
BUN/Creatinine Ratio: 13 (ref 9–23)
BUN: 14 mg/dL (ref 6–20)
Bilirubin Total: 1.1 mg/dL (ref 0.0–1.2)
CO2: 22 mmol/L (ref 20–29)
Calcium: 10.1 mg/dL (ref 8.7–10.2)
Chloride: 101 mmol/L (ref 96–106)
Creatinine, Ser: 1.09 mg/dL — ABNORMAL HIGH (ref 0.57–1.00)
Globulin, Total: 2.4 g/dL (ref 1.5–4.5)
Glucose: 87 mg/dL (ref 70–99)
Potassium: 4.4 mmol/L (ref 3.5–5.2)
Sodium: 139 mmol/L (ref 134–144)
Total Protein: 7 g/dL (ref 6.0–8.5)
eGFR: 68 mL/min/{1.73_m2}

## 2024-04-18 LAB — RHEUMATOID FACTOR: Rheumatoid fact SerPl-aCnc: <10 [IU]/mL

## 2024-04-18 LAB — C-REACTIVE PROTEIN: CRP: <1 mg/L (ref 0–10)

## 2024-04-18 LAB — SEDIMENTATION RATE: Sed Rate: 8 mm/h (ref 0–32)

## 2024-04-18 LAB — ANA: Anti Nuclear Antibody (ANA): NEGATIVE

## 2024-07-17 ENCOUNTER — Ambulatory Visit: Payer: Self-pay | Admitting: Nurse Practitioner
